# Patient Record
Sex: Female | Born: 2000 | Race: White | Hispanic: No | Marital: Single | Smoking: Never smoker
Health system: Southern US, Community
[De-identification: ages and names within clinical notes are randomized; demographics above are authoritative.]

## PROBLEM LIST (undated history)

## (undated) DIAGNOSIS — L509 Urticaria, unspecified: Secondary | ICD-10-CM

## (undated) DIAGNOSIS — I10 Essential (primary) hypertension: Secondary | ICD-10-CM

## (undated) HISTORY — PX: NO PAST SURGERIES: SHX2092

## (undated) HISTORY — DX: Essential (primary) hypertension: I10

## (undated) HISTORY — DX: Urticaria, unspecified: L50.9

---
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
## 2022-08-16 ENCOUNTER — Ambulatory Visit (INDEPENDENT_AMBULATORY_CARE_PROVIDER_SITE_OTHER): Payer: BC Managed Care – PPO | Admitting: Allergy

## 2022-08-16 ENCOUNTER — Encounter: Payer: Self-pay | Admitting: Allergy

## 2022-08-16 VITALS — BP 118/72 | HR 86 | Resp 16 | Ht 62.0 in | Wt 178.2 lb

## 2022-08-16 DIAGNOSIS — R6889 Other general symptoms and signs: Secondary | ICD-10-CM

## 2022-08-16 DIAGNOSIS — H1013 Acute atopic conjunctivitis, bilateral: Secondary | ICD-10-CM

## 2022-08-16 DIAGNOSIS — H109 Unspecified conjunctivitis: Secondary | ICD-10-CM

## 2022-08-16 DIAGNOSIS — J310 Chronic rhinitis: Secondary | ICD-10-CM

## 2022-08-16 DIAGNOSIS — L299 Pruritus, unspecified: Secondary | ICD-10-CM | POA: Diagnosis not present

## 2022-08-16 DIAGNOSIS — L500 Allergic urticaria: Secondary | ICD-10-CM

## 2022-08-16 MED ORDER — EPINEPHRINE 0.3 MG/0.3ML IJ SOAJ: 0.3000 mg | INTRAMUSCULAR | 1 refills | Status: DC | PRN

## 2022-08-16 NOTE — Patient Instructions (Signed)
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   You can take up to 4 dose of a primary antihistamine per day if needed for hive control - you can take benadryl as needed for breakthrough allergy symptoms - recommend you have access to epipen device in case of severe allergic reaction and follow emergency action plan provided  - for general allergy symptoms can take Zyrtec, Allegra or Xyzal daily as needed - for itchy/watery eyes can use Pataday 1 drop each eye daily as needed - for nasal congestion can use nasal steroid like Flonase, Nasacort or Rhinocort - for nasal drainage can use Astepro All the above allergy medications are over-the-counter options  Follow-up in 4-6 months or sooner if needed

## 2022-08-16 NOTE — Progress Notes (Signed)
New Patient Note  RE: Brandi Sheppard MRN: [id number] DOB: 2001-05-26 Date of Office Visit: 08/16/2022   Primary care provider:none currently  Chief Complaint: allergies  History of present illness: Brandi Sheppard is a 22 y.o. female presenting today for evaluation of allergies.   She states she has broken out in hives after touching deer and goats.  She does deer hunting and states with the contact of the deer she has developed hives that are itchy.  She also reports throat tightness and throat closing sensation with this exposure.  She has similar symptoms if she touches goats.  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  She states with both of these exposure she has never tried use of gloves or any type of barrier between her and the animal.  As far as the deer go she can eat deer meat without any issue. She states she developed itching and hives after a giraffe got close to her face at the zoo.  She states with cat exposure has had itchy eyes.  She denies having hives randomly without identifiable trigger.  When she has had hives she has used topical steroid like hydrocortisone and has used benadryl that helps.   With pollen exposures she has experienced itchy/watery eyes, runny/stuffy nose and sneezing.  Claritin helps.     No history of eczema, asthma or food allergy.   Review of systems in the past 4 weeks: Review of Systems  Constitutional: Negative.   HENT: Negative.    Eyes: Negative.   Respiratory: Negative.    Cardiovascular: Negative.   Gastrointestinal: Negative.   Musculoskeletal: Negative.   Skin: Negative.   Allergic/Immunologic: Negative.   Neurological: Negative.     All other systems negative unless noted above in HPI  Past medical history: Past Medical History:  Diagnosis Date   Urticaria     Past surgical history: Past Surgical History:  Procedure Laterality Date   NO PAST SURGERIES      Family  history:  History reviewed. No pertinent family history.  Social history: Lives in a home without carpeting with gas and heat pump heating with heat pump cooling.  No pets in the home.  Dogs and goes outside the home.  She denies water damage, mildew or roaches in the home.  She is an Web designer.  She denies a smoking history.   Medication List: Current Outpatient Medications  Medication Sig Dispense Refill   EPINEPHrine 0.3 mg/0.3 mL IJ SOAJ injection Inject 0.3 mg into the muscle as needed for anaphylaxis. As needed for life-threatening allergic reactions 2 each 1   loratadine (CLARITIN) 10 MG tablet Take 10 mg by mouth daily.     No current facility-administered medications for this visit.    Known medication allergies: No Known Allergies   Physical examination: Blood pressure 118/72, pulse 86, resp. rate 16, height '5\' 2"'$  (1.575 m), weight 178 lb 3.2 oz (80.8 kg), SpO2 98 %.  General: Alert, interactive, in no acute distress. HEENT: PERRLA, TMs pearly gray, turbinates non-edematous without discharge, post-pharynx non erythematous. Neck: Supple without lymphadenopathy. Lungs: Clear to auscultation without wheezing, rhonchi or rales. {no increased work of breathing. CV: Normal S1, S2 without murmurs. Abdomen: Nondistended, nontender. Skin: Warm and dry, without lesions or rashes. Extremities:  No clubbing, cyanosis or edema. Neuro:   Grossly intact.  Diagnositics/Labs:  Allergy testing: Unable to perform today due to recent antihistamine use.  Took  Claritin this morning.  Assessment and plan: Allergic urticaria, pruritus, sensation of throat swelling Rhinoconjunctivitis   - will obtain environmental allergy panel as well as IgE to goat (there is not deer IgE testing available) - avoidance measures is the mainstay for management allergies - would recommend wearing gloves or some type barrier when you hunt deer and see if this helps decrease symptom onset -  recommend taking antihistamine regimen prior to goat and deer exposures which can include primary antihistamine ( like zyrtec, allegra, xyzal) and if needed an secondary antihistamine, pepcid.   You can take up to 4 dose of a primary antihistamine per day if needed for hive control - you can take benadryl as needed for breakthrough allergy symptoms - recommend you have access to epipen device in case of severe allergic reaction and follow emergency action plan provided  - for general allergy symptoms can take Zyrtec, Allegra or Xyzal daily as needed - for itchy/watery eyes can use Pataday 1 drop each eye daily as needed - for nasal congestion can use nasal steroid like Flonase, Nasacort or Rhinocort - for nasal drainage can use Astepro All the above allergy medications are over-the-counter options  Follow-up in 4-6 months or sooner if needed  I appreciate the opportunity to take part in Brandi Sheppard's care. Please do not hesitate to contact me with questions.  Sincerely,   Prudy Feeler, MD Allergy/Immunology [facility]

## 2022-08-21 LAB — ALLERGENS W/TOTAL IGE AREA 2
Alternaria Alternata IgE: 0.36 kU/L — AB
Aspergillus Fumigatus IgE: <0.1 kU/L
Bermuda Grass IgE: <0.1 kU/L
Cat Dander IgE: 0.42 kU/L — AB
Cedar, Mountain IgE: 0.13 kU/L — AB
Cladosporium Herbarum IgE: <0.1 kU/L
Cockroach, German IgE: <0.1 kU/L
Common Silver Birch IgE: <0.1 kU/L
Cottonwood IgE: 0.12 kU/L — AB
D Farinae IgE: <0.1 kU/L
D Pteronyssinus IgE: <0.1 kU/L
Dog Dander IgE: 4.66 kU/L — AB
Elm, American IgE: <0.1 kU/L
IgE (Immunoglobulin E), Serum: 230 IU/mL (ref 6–495)
Johnson Grass IgE: <0.1 kU/L
Maple/Box Elder IgE: 0.1 kU/L — AB
Mouse Urine IgE: 0.47 kU/L — AB
Oak, White IgE: <0.1 kU/L
Pecan, Hickory IgE: <0.1 kU/L
Penicillium Chrysogen IgE: <0.1 kU/L
Pigweed, Rough IgE: <0.1 kU/L
Ragweed, Short IgE: <0.1 kU/L
Sheep Sorrel IgE Qn: <0.1 kU/L
Timothy Grass IgE: <0.1 kU/L
White Mulberry IgE: <0.1 kU/L

## 2022-08-21 LAB — E080-IGE GOAT EPITHELIA: E080-IgE Goat Epithelia: 13 kU/L — AB

## 2023-02-07 ENCOUNTER — Ambulatory Visit: Payer: BC Managed Care – PPO | Admitting: Allergy

## 2024-05-29 ENCOUNTER — Ambulatory Visit: Admitting: Allergy and Immunology

## 2024-07-01 ENCOUNTER — Ambulatory Visit: Admitting: Allergy and Immunology

## 2024-07-01 ENCOUNTER — Encounter: Payer: Self-pay | Admitting: Allergy and Immunology

## 2024-07-01 VITALS — BP 126/82 | HR 103 | Resp 16 | Ht 62.0 in | Wt 172.4 lb

## 2024-07-01 DIAGNOSIS — L500 Allergic urticaria: Secondary | ICD-10-CM | POA: Diagnosis not present

## 2024-07-01 DIAGNOSIS — J3081 Allergic rhinitis due to animal (cat) (dog) hair and dander: Secondary | ICD-10-CM | POA: Diagnosis not present

## 2024-07-01 MED ORDER — EPINEPHRINE 0.3 MG/0.3ML IJ SOAJ: 0.3000 mg | INTRAMUSCULAR | 1 refills | Status: AC | PRN

## 2024-07-01 NOTE — Progress Notes (Unsigned)
 "  [place] - [place] - [place] - [place] - [place]   Follow-up Note  Referring Provider: Lang Greig CROME, MD Primary Provider: Lang Greig CROME, MD Date of Office Visit: 07/01/2024  Subjective:   Brandi Sheppard (DOB: 22-Mar-2001) is a 24 y.o. 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  I have never seen her in this clinic and her last visit with Dr. Jeneal was 16 August 2022.  She can consume mammal meat with no problem including deer but if she is in close proximity to the animal itself, assuming that this is secondary to dander exposure, she gets diffuse urticaria.  She limits her exposure at this point in time and she will take some Xyzal up to 5 tablets before having an exposure when she goes hunting or she needs to care for goats.  She does have an injectable epinephrine  device.  She has had very little issue with her nose.  She will intermittently use a antihistamine.  Allergies as of 07/01/2024   No Known Allergies      Medication List    amLODipine 5 MG tablet Commonly known as: NORVASC Take 5 mg by mouth daily.   EPINEPHrine  0.3 mg/0.3 mL Soaj injection Commonly known as: EPI-PEN Inject 0.3 mg into the muscle as needed for anaphylaxis. As needed for life-threatening allergic reactions   levocetirizine 5 MG tablet Commonly known as: XYZAL Take 5 mg by mouth every evening.    Past Medical History:  Diagnosis Date   Hypertension    Urticaria     Past Surgical History:  Procedure Laterality Date   NO PAST SURGERIES      Review of systems negative except as noted in HPI / PMHx or noted below:  Review of Systems  Constitutional: Negative.   HENT: Negative.    Eyes: Negative.   Respiratory: Negative.    Cardiovascular: Negative.   Gastrointestinal: Negative.   Genitourinary:  Negative.   Musculoskeletal: Negative.   Skin: Negative.   Neurological: Negative.   Endo/Heme/Allergies: Negative.   Psychiatric/Behavioral: Negative.       Objective:   Vitals:   07/01/24 1348  BP: 126/82  Pulse: (!) 103  Resp: 16  SpO2: 98%   Height: 5' 2 (157.5 cm)  Weight: 172 lb 6.4 oz (78.2 kg)   Physical Exam Constitutional:      Appearance: She is not diaphoretic.  HENT:     Head: Normocephalic.     Right Ear: Tympanic membrane, ear canal and external ear normal.     Left Ear: Tympanic membrane, ear canal and external ear normal.     Nose: Nose normal. No mucosal edema or rhinorrhea.     Mouth/Throat:     Pharynx: Uvula midline. No oropharyngeal exudate.  Eyes:     Conjunctiva/sclera: Conjunctivae normal.  Neck:     Thyroid: No thyromegaly.     Trachea: Trachea normal. No tracheal tenderness or tracheal deviation.  Cardiovascular:     Rate and Rhythm: Normal rate and regular rhythm.     Heart sounds: Normal heart sounds, S1 normal and S2 normal. No murmur heard. Pulmonary:     Effort: No respiratory distress.     Breath sounds: Normal breath sounds. No stridor. No wheezing or rales.  Lymphadenopathy:     Head:     Right side of  head: No tonsillar adenopathy.     Left side of head: No tonsillar adenopathy.     Cervical: No cervical adenopathy.  Skin:    Findings: No erythema or rash.     Nails: There is no clubbing.  Neurological:     Mental Status: She is alert.     Diagnostics: none  Assessment and Plan:   1. Allergic urticaria    Judie will continue to use OTC Xyzal prior to animal exposure and will have an Epi-pen available should it be required. We will see her back in the is clinic in 1 year or earlier if problem.   Camellia Denis, MD Allergy / Immunology [facility] "

## 2024-07-02 ENCOUNTER — Encounter: Payer: Self-pay | Admitting: Allergy and Immunology

## 2025-06-30 ENCOUNTER — Ambulatory Visit: Admitting: Allergy and Immunology
